# Patient Record
Sex: Female | Born: 1985 | Race: White | Hispanic: No | Marital: Married | Smoking: Never smoker
Health system: Southern US, Community
[De-identification: ages and names within clinical notes are randomized; demographics above are authoritative.]

## PROBLEM LIST (undated history)

## (undated) DIAGNOSIS — K5090 Crohn's disease, unspecified, without complications: Secondary | ICD-10-CM

---

## 2011-08-09 DIAGNOSIS — T8859XA Other complications of anesthesia, initial encounter: Secondary | ICD-10-CM

## 2011-08-09 HISTORY — DX: Other complications of anesthesia, initial encounter: T88.59XA

## 2020-01-26 ENCOUNTER — Emergency Department (HOSPITAL_COMMUNITY): Payer: BC Managed Care – PPO | Admitting: Anesthesiology

## 2020-01-26 ENCOUNTER — Encounter (HOSPITAL_COMMUNITY)
Admission: EM | Disposition: A | Discharge status: Home / Self Care | Payer: Self-pay | Source: Home / Self Care | Attending: Emergency Medicine

## 2020-01-26 ENCOUNTER — Encounter (HOSPITAL_COMMUNITY): Payer: Self-pay | Admitting: Emergency Medicine

## 2020-01-26 ENCOUNTER — Emergency Department (HOSPITAL_COMMUNITY)
Admission: EM | Admit: 2020-01-26 | Discharge: 2020-01-26 | Disposition: A | Discharge status: Home / Self Care | Payer: BC Managed Care – PPO | Attending: Emergency Medicine | Admitting: Emergency Medicine

## 2020-01-26 ENCOUNTER — Other Ambulatory Visit: Payer: Self-pay

## 2020-01-26 ENCOUNTER — Emergency Department (HOSPITAL_COMMUNITY): Payer: BC Managed Care – PPO

## 2020-01-26 DIAGNOSIS — K5090 Crohn's disease, unspecified, without complications: Secondary | ICD-10-CM | POA: Insufficient documentation

## 2020-01-26 DIAGNOSIS — K222 Esophageal obstruction: Secondary | ICD-10-CM

## 2020-01-26 DIAGNOSIS — Z20822 Contact with and (suspected) exposure to covid-19: Secondary | ICD-10-CM | POA: Diagnosis not present

## 2020-01-26 DIAGNOSIS — X58XXXA Exposure to other specified factors, initial encounter: Secondary | ICD-10-CM | POA: Insufficient documentation

## 2020-01-26 DIAGNOSIS — Y929 Unspecified place or not applicable: Secondary | ICD-10-CM | POA: Diagnosis not present

## 2020-01-26 DIAGNOSIS — T18128A Food in esophagus causing other injury, initial encounter: Secondary | ICD-10-CM | POA: Diagnosis not present

## 2020-01-26 DIAGNOSIS — Y9389 Activity, other specified: Secondary | ICD-10-CM | POA: Insufficient documentation

## 2020-01-26 DIAGNOSIS — Y999 Unspecified external cause status: Secondary | ICD-10-CM | POA: Diagnosis not present

## 2020-01-26 DIAGNOSIS — K224 Dyskinesia of esophagus: Secondary | ICD-10-CM | POA: Diagnosis present

## 2020-01-26 HISTORY — PX: FOREIGN BODY REMOVAL: SHX962

## 2020-01-26 HISTORY — PX: BIOPSY: SHX5522

## 2020-01-26 HISTORY — DX: Crohn's disease, unspecified, without complications: K50.90

## 2020-01-26 HISTORY — PX: ESOPHAGOGASTRODUODENOSCOPY (EGD) WITH PROPOFOL: SHX5813

## 2020-01-26 LAB — COMPREHENSIVE METABOLIC PANEL
ALT: 21 U/L (ref 0–44)
AST: 30 U/L (ref 15–41)
Albumin: 4.2 g/dL (ref 3.5–5.0)
Alkaline Phosphatase: 42 U/L (ref 38–126)
Anion gap: 10 (ref 5–15)
BUN: 10 mg/dL (ref 6–20)
CO2: 27 mmol/L (ref 22–32)
Calcium: 9.6 mg/dL (ref 8.9–10.3)
Chloride: 104 mmol/L (ref 98–111)
Creatinine, Ser: 0.95 mg/dL (ref 0.44–1.00)
GFR calc Af Amer: >60 mL/min (ref >60)
GFR calc non Af Amer: >60 mL/min (ref >60)
Glucose, Bld: 87 mg/dL (ref 70–99)
Potassium: 3.4 mmol/L — ABNORMAL LOW (ref 3.5–5.1)
Sodium: 141 mmol/L (ref 135–145)
Total Bilirubin: 0.6 mg/dL (ref 0.3–1.2)
Total Protein: 7.7 g/dL (ref 6.5–8.1)

## 2020-01-26 LAB — CBC WITH DIFFERENTIAL/PLATELET
Abs Immature Granulocytes: 0.02 10*3/uL (ref 0.00–0.07)
Basophils Absolute: 0.1 10*3/uL (ref 0.0–0.1)
Basophils Relative: 1 %
Eosinophils Absolute: 0.4 10*3/uL (ref 0.0–0.5)
Eosinophils Relative: 5 %
HCT: 44.3 % (ref 36.0–46.0)
Hemoglobin: 14.3 g/dL (ref 12.0–15.0)
Immature Granulocytes: 0 %
Lymphocytes Relative: 25 %
Lymphs Abs: 2 10*3/uL (ref 0.7–4.0)
MCH: 30 pg (ref 26.0–34.0)
MCHC: 32.3 g/dL (ref 30.0–36.0)
MCV: 92.9 fL (ref 80.0–100.0)
Monocytes Absolute: 0.5 10*3/uL (ref 0.1–1.0)
Monocytes Relative: 7 %
Neutro Abs: 4.8 10*3/uL (ref 1.7–7.7)
Neutrophils Relative %: 62 %
Platelets: 345 10*3/uL (ref 150–400)
RBC: 4.77 MIL/uL (ref 3.87–5.11)
RDW: 13.7 % (ref 11.5–15.5)
WBC: 7.8 10*3/uL (ref 4.0–10.5)
nRBC: 0 % (ref 0.0–0.2)

## 2020-01-26 LAB — LIPASE, BLOOD: Lipase: 35 U/L (ref 11–51)

## 2020-01-26 LAB — SARS CORONAVIRUS 2 BY RT PCR (HOSPITAL ORDER, PERFORMED IN ~~LOC~~ HOSPITAL LAB): SARS Coronavirus 2: NEGATIVE

## 2020-01-26 SURGERY — ESOPHAGOGASTRODUODENOSCOPY (EGD) WITH PROPOFOL
Anesthesia: General

## 2020-01-26 SURGERY — ESOPHAGOGASTRODUODENOSCOPY (EGD) WITH PROPOFOL
Anesthesia: Monitor Anesthesia Care | Laterality: Left

## 2020-01-26 MED ORDER — KETOROLAC TROMETHAMINE 30 MG/ML IJ SOLN
30.0000 mg | Freq: Once | INTRAMUSCULAR | Status: AC
  Administered 2020-01-26: 30 mg via INTRAVENOUS
  Filled 2020-01-26: qty 1

## 2020-01-26 MED ORDER — ONDANSETRON HCL 4 MG/5ML PO SOLN
4.0000 mg | Freq: Once | ORAL | Status: DC
  Filled 2020-01-26: qty 5

## 2020-01-26 MED ORDER — PHENYLEPHRINE 40 MCG/ML (10ML) SYRINGE FOR IV PUSH (FOR BLOOD PRESSURE SUPPORT)
PREFILLED_SYRINGE | INTRAVENOUS | Status: DC | PRN
  Administered 2020-01-26 (×2): 80 ug via INTRAVENOUS

## 2020-01-26 MED ORDER — ONDANSETRON HCL 4 MG/2ML IJ SOLN: 4.0000 mg | Freq: Once | INTRAMUSCULAR | Status: DC

## 2020-01-26 MED ORDER — SUCCINYLCHOLINE CHLORIDE 200 MG/10ML IV SOSY
PREFILLED_SYRINGE | INTRAVENOUS | Status: DC | PRN
  Administered 2020-01-26: 140 mg via INTRAVENOUS

## 2020-01-26 MED ORDER — SODIUM CHLORIDE 0.9 % IV BOLUS
1000.0000 mL | Freq: Once | INTRAVENOUS | Status: AC
  Administered 2020-01-26: 1000 mL via INTRAVENOUS

## 2020-01-26 MED ORDER — ONDANSETRON HCL 4 MG/2ML IJ SOLN
INTRAMUSCULAR | Status: AC
  Filled 2020-01-26: qty 2

## 2020-01-26 MED ORDER — FENTANYL CITRATE (PF) 100 MCG/2ML IJ SOLN
INTRAMUSCULAR | Status: DC | PRN
  Administered 2020-01-26: 75 ug via INTRAVENOUS
  Administered 2020-01-26: 25 ug via INTRAVENOUS

## 2020-01-26 MED ORDER — PROPOFOL 10 MG/ML IV BOLUS
INTRAVENOUS | Status: DC | PRN
  Administered 2020-01-26: 130 mg via INTRAVENOUS

## 2020-01-26 MED ORDER — PANTOPRAZOLE SODIUM 40 MG IV SOLR
40.0000 mg | Freq: Once | INTRAVENOUS | Status: AC
  Administered 2020-01-26: 40 mg via INTRAVENOUS
  Filled 2020-01-26: qty 40

## 2020-01-26 MED ORDER — PROPOFOL 10 MG/ML IV BOLUS
INTRAVENOUS | Status: AC
  Filled 2020-01-26: qty 20

## 2020-01-26 MED ORDER — MIDAZOLAM HCL 5 MG/5ML IJ SOLN
INTRAMUSCULAR | Status: DC | PRN
  Administered 2020-01-26: 2 mg via INTRAVENOUS

## 2020-01-26 MED ORDER — GLUCAGON HCL RDNA (DIAGNOSTIC) 1 MG IJ SOLR
1.0000 mg | Freq: Once | INTRAMUSCULAR | Status: AC
  Administered 2020-01-26: 1 mg via INTRAVENOUS
  Filled 2020-01-26: qty 1

## 2020-01-26 MED ORDER — SODIUM CHLORIDE 0.9 % IV SOLN: INTRAVENOUS | Status: DC

## 2020-01-26 MED ORDER — FENTANYL CITRATE (PF) 100 MCG/2ML IJ SOLN
INTRAMUSCULAR | Status: AC
  Filled 2020-01-26: qty 2

## 2020-01-26 MED ORDER — MIDAZOLAM HCL 2 MG/2ML IJ SOLN
INTRAMUSCULAR | Status: AC
  Filled 2020-01-26: qty 2

## 2020-01-26 MED ORDER — SODIUM CHLORIDE 0.9 % IV BOLUS: 1000.0000 mL | Freq: Once | INTRAVENOUS | Status: DC

## 2020-01-26 MED ORDER — ONDANSETRON HCL 4 MG/2ML IJ SOLN
4.0000 mg | Freq: Once | INTRAMUSCULAR | Status: DC
  Administered 2020-01-26: 4 mg via INTRAVENOUS
  Filled 2020-01-26: qty 2

## 2020-01-26 MED ORDER — LACTATED RINGERS IV SOLN
INTRAVENOUS | Status: DC
  Administered 2020-01-26: 1000 mL via INTRAVENOUS

## 2020-01-26 SURGICAL SUPPLY — 14 items

## 2020-01-26 NOTE — Op Note (Signed)
[facility] Patient Name: Cynthia Santiago Procedure Date: 01/26/2020 MRN: [id number] Attending MD: Charna Elizabeth , MD Date of Birth: 06/28/1986 CSN: [id number] Age: 34 Admit Type: Outpatient Procedure:                EGD with removal of a food impaction and esophageal                            biopsies. Indications:              Dysphagia, Gastro-esophageal reflux disease. Providers:                Charna Elizabeth, MD, Rogue Jury, RN, Dayton Bailiff, RN, Wanita Chamberlain, Technician, Paris Lore CRNA. Referring MD:             Joselyn Arrow, MD Medicines:                General Anesthesia. Complications:            No immediate complications. Estimated Blood Loss:     Estimated blood loss was minimal. Procedure:                Pre-Anesthesia Assessment: - Prior to the                            procedure, a history and physical was performed,                            and patient medications and allergies were                            reviewed. The patient's tolerance of previous                            anesthesia was also reviewed. The risks and                            benefits of the procedure and the sedation options                            and risks were discussed with the patient. All                            questions were answered, and informed consent was                            obtained. Prior Anticoagulants: The patient has                            taken no previous anticoagulant or antiplatelet  agents. ASA Grade Assessment: II - A patient with                            mild systemic disease. After reviewing the risks                            and benefits, the patient was deemed in                            satisfactory condition to undergo the procedure.                            After obtaining informed consent, the endoscope was                             passed under direct vision. Throughout the                            procedure, the patient's blood pressure, pulse, and                            oxygen saturations were monitored continuously. The                            GIF-H190 ([id number]) Olympus gastroscope was                            introduced through the mouth, and advanced to the                            second part of duodenum. The EGD was extremely                            difficult due to presence of food imaction. The                            patient tolerated the procedure well. Scope In: Scope Out: Findings:      A large meat bolus was found in the middle third of the esophagus; this       was broken up in fragments with a Rat tooth forceps and 13 mm snare and       after some pieces were removed the rest of the bolus was "gently pushed"       into the stomach.      Mucosal changes including ringed esophagus were found in the middle       third of the esophagus and in the lower third of the esophagus;       esophageal findings were graded using the Eosinophilic Esophagitis       Endoscopic Reference Score (EoE-EREFS) as: Edema Grade 0 Normal       (distinct vascular markings), Rings Grade 2 Moderate (distinct rings       that do not occlude passage of diagnostic 8-10 mm endoscope), Exudates       Grade 1 Mild (scattered white lesions involving less than 10 percent of  the esophageal surface area), Furrows Grade 0 None (no vertical lines       seen) and Stricture none (no stricture found)-biopsies were taken with a       cold forceps for histology.      The entire examined stomach was normal.      The cardia and gastric fundus were normal on retroflexion.      The examined duodenum was normal. Impression:               - Large meat bolus in the middle third of the                            esophagus-removal was successful-see description                            above.                           -  Esophageal mucosal changes suggestive of                            Eosinophilic Esophagitis-biopsies done.                           - Normal appearing stomach.                           - Normal examined duodenum. Moderate Sedation:      GA used,. Recommendation:           - Full liquid diet daily.                           - Continue present medications; may need to start                            Flovent inhaler 880 mcg 2 puffs every 12 hours.                           - Await pathology results.                           - Use Protonix (Pantoprazole) 40 mg PO daily.                           - No Ibuprofen, Naproxen, or other non-steroidal                            anti-inflammatory drugs for now.                           - Return to primary GI office in 1-2 weeks. Procedure Code(s):        --- Professional ---                           [phone], 59, Esophagogastroduodenoscopy, flexible,  transoral; with removal of foreign body(s)                           43239, Esophagogastroduodenoscopy, flexible,                            transoral; with biopsy, single or multiple Diagnosis Code(s):        --- Professional ---                           R13.10, Dysphagia, unspecified                           K21.9, Gastro-esophageal reflux disease without                            esophagitis                           T18.128A, Food in esophagus causing other injury,                            initial encounter                           K22.8, Other specified diseases of esophagus CPT copyright 2019 [employer]. All rights reserved. The codes documented in this report are preliminary and upon coder review may  be revised to meet current compliance requirements. Charna Elizabeth, MD Charna Elizabeth, MD 01/26/2020 12:30:18 PM This report has been signed electronically. Number of Addenda: 0

## 2020-01-26 NOTE — ED Provider Notes (Signed)
[facility] DEPT Provider Note   CSN: [id number] Arrival date & time: 01/26/20  9528     History Chief Complaint  Patient presents with  . Gastroesophageal Reflux    Cynthia Santiago is a 34 y.o. female with pertinent past medical history of Crohn's disease that presents the emergency department today for esophageal spasms.  Patient states that she ate a piece of chicken on Friday, it got stuck in her throat and she vomited up.  States that it occurred again last night with chicken, however it took her a while to vomit it up.  She states that she does not think she vomited it entirely.  She states since then she is having esophageal spasms every time she swallows her own spit.  She says that she cannot keep anything down.  States that it feels like a stabbing sensation and something is stuck in her throat.  States that she is normally able to vomit up her food whenever this occurs, this has never happened to this extent before.  She states that her Crohn's is well controlled, she states that she thinks she got an endoscopy in March of this year with no strictures or abnormalities of her esophagus.  States that she cannot swallow her spit currently, however denies any trouble breathing.  Also admits to some regurgitation, states that she started having acid reflux after this happened last night. Denies any chest pain, shortness of breath, back pain, dizziness, headache, vision changes.  No fevers, chills.  Was in normal health before this.  HPI     Past Medical History:  Diagnosis Date  . Complication of anesthesia 2013   awake during anesthesia  . Crohn's disease ([place])     There are no problems to display for this patient.   History reviewed. No pertinent surgical history.   OB History   No obstetric history on file.     History reviewed. No pertinent family history.  Social History   Tobacco Use  . Smoking status: Never Smoker  . Smokeless  tobacco: Never Used  Substance Use Topics  . Alcohol use: Not on file  . Drug use: Not on file    Home Medications Prior to Admission medications   Medication Sig Start Date End Date Taking? Authorizing Provider  albuterol (VENTOLIN HFA) 108 (90 Base) MCG/ACT inhaler Inhale 1-2 puffs into the lungs every 6 (six) hours as needed for wheezing or shortness of breath. 01/18/20  Yes [provider]  balsalazide (COLAZAL) 750 MG capsule Take 3,750 mg by mouth daily. 09/24/19  Yes [provider]  cetirizine (ZYRTEC) 10 MG tablet Take 10 mg by mouth daily.   Yes [provider]  etonogestrel-ethinyl estradiol (NUVARING) 0.12-0.015 MG/24HR vaginal ring Place 1 each vaginally every 21 ( twenty-one) days. 01/16/20  Yes [provider]  STELARA 90 MG/ML SOSY injection Inject 1 mL into the skin every 8 (eight) weeks. 01/16/20  Yes [provider]    Allergies    Shellfish allergy, Ginger, and Morphine  Review of Systems   Review of Systems  Constitutional: Negative for chills, diaphoresis, fatigue and fever.  HENT: Positive for trouble swallowing. Negative for congestion, ear discharge, ear pain, facial swelling, hearing loss, mouth sores, sinus pressure and sore throat.   Eyes: Negative for pain and visual disturbance.  Respiratory: Negative for cough, shortness of breath and wheezing.   Cardiovascular: Negative for chest pain, palpitations and leg swelling.  Gastrointestinal: Positive for nausea. Negative for abdominal  distention, abdominal pain, diarrhea and vomiting.  Genitourinary: Negative for difficulty urinating.  Musculoskeletal: Negative for back pain, neck pain and neck stiffness.  Skin: Negative for pallor.  Neurological: Negative for dizziness, speech difficulty, weakness and headaches.  Psychiatric/Behavioral: Negative for confusion.    Physical Exam Updated Vital Signs BP 113/75   Pulse 71   Temp (!) 97 F (36.1 C) (Oral)   Resp 16    Ht 5\' 3"  (1.6 m)   Wt 47.6 kg   LMP 01/19/2020   SpO2 100%   BMI 18.60 kg/m   Physical Exam Constitutional:      General: She is not in acute distress.    Appearance: Normal appearance. She is not ill-appearing, toxic-appearing or diaphoretic.  HENT:     Head:     Jaw: There is normal jaw occlusion. No trismus, tenderness, swelling or malocclusion.     Comments: Patient without drooling, no trismus.  Patient is able to speak to me in full sentences without any respiratory compromise.  Patient states that she is unable to handle her secretions at this time.    Mouth/Throat:     Mouth: Mucous membranes are moist. No angioedema.     Pharynx: Oropharynx is clear. Uvula midline. No pharyngeal swelling, oropharyngeal exudate, posterior oropharyngeal erythema or uvula swelling.     Tonsils: No tonsillar exudate. 1+ on the right. 1+ on the left.     Comments: Nothing lodged in posterior pharynx, and unable to see further than that. Eyes:     General: No scleral icterus.    Extraocular Movements: Extraocular movements intact.     Pupils: Pupils are equal, round, and reactive to light.  Cardiovascular:     Rate and Rhythm: Normal rate and regular rhythm.     Pulses: Normal pulses.     Heart sounds: Normal heart sounds.  Pulmonary:     Effort: Pulmonary effort is normal. No respiratory distress.     Breath sounds: Normal breath sounds. No stridor. No wheezing, rhonchi or rales.  Chest:     Chest wall: No tenderness.  Abdominal:     General: Abdomen is flat. There is no distension.     Palpations: Abdomen is soft.     Tenderness: There is no abdominal tenderness. There is no guarding or rebound.  Musculoskeletal:        General: No swelling or tenderness. Normal range of motion.     Cervical back: Normal range of motion and neck supple. No rigidity.     Right lower leg: No edema.     Left lower leg: No edema.  Skin:    General: Skin is warm and dry.     Capillary Refill: Capillary  refill takes less than 2 seconds.     Coloration: Skin is not pale.  Neurological:     General: No focal deficit present.     Mental Status: She is alert and oriented to person, place, and time.  Psychiatric:        Mood and Affect: Mood normal.        Behavior: Behavior normal.     ED Results / Procedures / Treatments   Labs (all labs ordered are listed, but only abnormal results are displayed) Labs Reviewed  COMPREHENSIVE METABOLIC PANEL - Abnormal; Notable for the following components:      Result Value   Potassium 3.4 (*)    All other components within normal limits  SARS CORONAVIRUS 2 BY RT PCR (HOSPITAL ORDER, PERFORMED IN  [facility])  SARS CORONAVIRUS 2 (TAT 6-24 HRS)  CBC WITH DIFFERENTIAL/PLATELET  LIPASE, BLOOD  SURGICAL PATHOLOGY    EKG None  Radiology DG Chest 2 View  Result Date: 01/26/2020 CLINICAL DATA:  Esophageal spasm. EXAM: CHEST - 2 VIEW COMPARISON:  None. FINDINGS: The heart, hila, and mediastinum are normal. No nodules or masses. No focal infiltrates. No other abnormalities. IMPRESSION: No active cardiopulmonary disease. Electronically Signed   By: Gerome Sam III M.D   On: 01/26/2020 09:31    Procedures Procedures (including critical care time)  Medications Ordered in ED Medications  0.9 %  sodium chloride infusion (has no administration in time range)  sodium chloride 0.9 % bolus 1,000 mL (1,000 mLs Intravenous Refused 01/26/20 1422)  glucagon (human recombinant) (GLUCAGEN) injection 1 mg (1 mg Intravenous Given 01/26/20 0846)  pantoprazole (PROTONIX) injection 40 mg (40 mg Intravenous Given 01/26/20 0846)  sodium chloride 0.9 % bolus 1,000 mL (0 mLs Intravenous Stopped 01/26/20 1041)  ketorolac (TORADOL) 30 MG/ML injection 30 mg (30 mg Intravenous Given 01/26/20 1422)    ED Course  I have reviewed the triage vital signs and the nursing notes.  Pertinent labs & imaging results that were available during my care of the patient  were reviewed by me and considered in my medical decision making (see chart for details).    MDM Rules/Calculators/A&P                         Cynthia Santiago is a 34 y.o. female with pertinent past medical history of Crohn's disease that presents the emergency department today for esophageal spasms.patient is stable without respiratory compromise, states that she is unable to handle her secretions.  When patient swallows her own spit, she starts to have esophageal spasms.  No drooling, trismus, muffled voice. Concerns for food bolus impaction, will consult GI at this time.  We will also give glucagon at this time.  CBC, CMP, lipase stable.   Reassessed patient after glucagon, patient is still unable to pass p.o. challenge with water.  Continues to have esophageal spasms. CXR negative.  945 spoke to Dr. Loreta Ave, GI who states that she will try and get her scheduled in today, states that she will call me back with the plan.  Patient to be taken to the endoscopy suite in a couple hours.  Patient has returned from endoscopy suite, took them 30 minutes to retrieve food bolus.  Patient states that  she was able to feel relief, has been able to drink Sprite without any regurgitation.  Is able to handle secretions now.  Still complaining of pain and feeling dehydrated, will give IV fluids and pain medication at this time and reassess.  CMP stable, potassium at 3.4.  Shared decision making, patient states that she will eats bananas at home. After reassessment an hour later, patient states that she feels better, pain has been controlled.  States that she wants to go home.  I was able to watch patient drink a full cup of Sprite without any esophageal spasms or vomiting.  Patient states that she is able to handle her secretions now.  Patient ready for discharge.  Doubt need for further emergent work up at this time. I explained the diagnosis and have given explicit precautions to return to the ER including for any  other new or worsening symptoms. The patient understands and accepts the medical plan as it's been dictated and I have answered their questions.  Discharge instructions concerning home care and prescriptions have been given. The patient is STABLE and is discharged to home in good condition.     Final Clinical Impression(s) / ED Diagnoses Final diagnoses:  Esophageal obstruction due to food impaction    Rx / DC Orders ED Discharge Orders    None       Farrel Gordon, PA-C 01/26/20 1525    Rolan Bucco, MD 01/29/20 [phone]

## 2020-01-26 NOTE — Anesthesia Procedure Notes (Addendum)
Procedure Name: Intubation Date/Time: 01/26/2020 11:39 AM Performed by: Anne Fu, CRNA Pre-anesthesia Checklist: Patient identified, Emergency Drugs available, Suction available, Patient being monitored and Timeout performed Patient Re-evaluated:Patient Re-evaluated prior to induction Oxygen Delivery Method: Circle system utilized Preoxygenation: Pre-oxygenation with 100% oxygen Induction Type: IV induction, Rapid sequence and Cricoid Pressure applied Laryngoscope Size: Mac and 4 Grade View: Grade I Tube type: Oral Tube size: 7.5 mm Number of attempts: 1 Airway Equipment and Method: Stylet Placement Confirmation: ETT inserted through vocal cords under direct vision,  positive ETCO2 and breath sounds checked- equal and bilateral Secured at: 19 cm Tube secured with: Tape Dental Injury: Teeth and Oropharynx as per pre-operative assessment

## 2020-01-26 NOTE — Anesthesia Preprocedure Evaluation (Signed)
Anesthesia Evaluation  Patient identified by MRN, date of birth, ID band Patient awake    Reviewed: Allergy & Precautions, NPO status , Patient's Chart, lab work & pertinent test results  Airway Mallampati: I  TM Distance: >3 FB Neck ROM: Full    Dental   Pulmonary    Pulmonary exam normal        Cardiovascular Normal cardiovascular exam     Neuro/Psych    GI/Hepatic   Endo/Other    Renal/GU      Musculoskeletal   Abdominal   Peds  Hematology   Anesthesia Other Findings   Reproductive/Obstetrics                             Anesthesia Physical Anesthesia Plan  ASA: II  Anesthesia Plan: General   Post-op Pain Management:    Induction: Intravenous, Rapid sequence and Cricoid pressure planned  PONV Risk Score and Plan: Ondansetron  Airway Management Planned: Oral ETT  Additional Equipment:   Intra-op Plan:   Post-operative Plan: Extubation in OR  Informed Consent: I have reviewed the patients History and Physical, chart, labs and discussed the procedure including the risks, benefits and alternatives for the proposed anesthesia with the patient or authorized representative who has indicated his/her understanding and acceptance.       Plan Discussed with: CRNA and Surgeon  Anesthesia Plan Comments:         Anesthesia Quick Evaluation

## 2020-01-26 NOTE — Discharge Instructions (Addendum)
You are seen today for food bolus impaction, follow the instructions that I gave you at the endoscopy suite.  If you have any worsening or new concerning symptoms please come back to the emergency department.  As you spoke about your potassium was 3.4, I think this will come back to normal once you start eating.  If this continues to happen I think you should follow-up with your gastroenterologist as we discussed.  I hope you feel better!

## 2020-01-26 NOTE — Progress Notes (Signed)
Post procedure: patient stated pain and tightness in right upper chest. Anesthesia MD and procedure MD assessed patient. No new orders.Report given to ED RN. RN verbalized understanding.

## 2020-01-26 NOTE — ED Notes (Signed)
Pt ambulated to Xray 

## 2020-01-26 NOTE — Anesthesia Postprocedure Evaluation (Signed)
Anesthesia Post Note  Patient: Cynthia Santiago  Procedure(s) Performed: ESOPHAGOGASTRODUODENOSCOPY (EGD) WITH PROPOFOL (N/A ) BIOPSY FOREIGN BODY REMOVAL     Patient location during evaluation: PACU Anesthesia Type: General Level of consciousness: awake and alert Pain management: pain level controlled Vital Signs Assessment: post-procedure vital signs reviewed and stable Respiratory status: spontaneous breathing, nonlabored ventilation, respiratory function stable and patient connected to nasal cannula oxygen Cardiovascular status: blood pressure returned to baseline and stable Postop Assessment: no apparent nausea or vomiting Anesthetic complications: no   No complications documented.  Last Vitals:  Vitals:   01/26/20 1250 01/26/20 1324  BP: 114/78 118/88  Pulse: 70 73  Resp: 12 16  Temp:    SpO2: 100% 97%    Last Pain:  Vitals:   01/26/20 1324  TempSrc:   PainSc: 7                  Stina Gane DAVID

## 2020-01-26 NOTE — ED Triage Notes (Signed)
Per pt, states she got a piece of chicken stuck in throat-states she threw up chicken but still having esophogeal spasms-cant keep anything down

## 2020-01-26 NOTE — Transfer of Care (Signed)
Immediate Anesthesia Transfer of Care Note  Patient: Cynthia Santiago  Procedure(s) Performed: Procedure(s): ESOPHAGOGASTRODUODENOSCOPY (EGD) WITH PROPOFOL (N/A)  Patient Location: PACU  Anesthesia Type:General  Level of Consciousness:  sedated, patient cooperative and responds to stimulation  Airway & Oxygen Therapy:Patient Spontanous Breathing and Patient connected to face mask oxgen  Post-op Assessment:  Report given to PACU RN and Post -op Vital signs reviewed and stable  Post vital signs:  Reviewed and stable  Last Vitals:  Vitals:   01/26/20 1000 01/26/20 1052  BP: 110/76 119/75  Pulse: 61 77  Resp: 16 11  Temp:  36.7 C  SpO2: 100% 100%    Complications: No apparent anesthesia complications

## 2020-01-26 NOTE — Consult Note (Signed)
UNASSIGNED PATIENT/CROSS COVER [facility] Reason for Consult: Food impaction since last night. Referring Physician: Alvie Heidelberg, MD Cynthia Santiago is an 34 y.o. female.  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  She was visiting a friend in [place] and therefore decided to come to the ER here instead of going to [facility] where she works. Apparently she had some chicken to eat and was not able to regurgitate it back up as she usually does when these episodes occur. She claims this has been a problem for the last couple of years but most the time she is able to vomit her food bolus back up. She has started having severe acid reflux and took 4 Tums to help her with her symptoms after " the chicken got stuck in her esophagus". She had a history of acid reflux for which she takes Tums periodically off and on. She is hesitant to use PPIs as she has a history of osteopenia.  She was diagnosed with Crohn's disease at the age of 43 and was on Humira for several years till she had a flare in September of last year and again in March of this year for which she was switched to [facility] and for the last couple of months has been doing well on the [place].  She denies having any aphthous ulcers, skin lesions, joint pains or ocular symptoms from her Crohn's disease.  She reportedly has both small and large bowel Crohn's. For several years she was treated as ulcerative colitis but when she developed small bowel disease her diagnosis was switched to Crohn's. She is apparently had spontaneous pneumothoraces in the past.  She is never had surgery for her Crohn's disease.  She is followed by Dr. Marta Antu at [facility] for her Crohn's disease.  She has a history of asthma and some seasonal allergies but has never been diagnosed with eosinophilic esophagitis in the past.  She claims she had an  EGD a couple of years ago that was essentially unrevealing But I do not have a copy procedure report available to me at the present time.  Past Medical History:  Diagnosis Date  . Crohn's disease both small bowel and colonic Crohn's (HCC)/asthma/seasonal allergies/history of spontaneous pneumothoraces/acid reflux.    History reviewed. No pertinent surgical history.  No family history on file.  Social History:  has no history on file for tobacco use, alcohol use, and drug use.  She is married and she lives in [place]. She works as a Therapist, occupational at [employer].   Allergies:  Allergies  Allergen Reactions  . Shellfish Allergy Rash and Shortness Of Breath    Wheezing   . Morphine Nausea And Vomiting   Medications: I have reviewed the patient's current medications.  Results for orders placed or performed during the hospital encounter of 01/26/20 (from the past 48 hour(s))  CBC with Differential     Status: None   Collection Time: 01/26/20  8:20 AM  Result Value Ref Range   WBC 7.8 4.0 - 10.5 K/uL   RBC 4.77 3.87 - 5.11 MIL/uL   Hemoglobin 14.3 12.0 - 15.0 g/dL   HCT 40.3 36 - 46 %   MCV 92.9 80.0 - 100.0 fL   MCH 30.0 26.0 - 34.0 pg   MCHC 32.3 30.0 - 36.0 g/dL   RDW 13.7  11.5 - 15.5 %   Platelets 345 150 - 400 K/uL   nRBC 0.0 0.0 - 0.2 %   Neutrophils Relative % 62 %   Neutro Abs 4.8 1.7 - 7.7 K/uL   Lymphocytes Relative 25 %   Lymphs Abs 2.0 0.7 - 4.0 K/uL   Monocytes Relative 7 %   Monocytes Absolute 0.5 0 - 1 K/uL   Eosinophils Relative 5 %   Eosinophils Absolute 0.4 0 - 0 K/uL   Basophils Relative 1 %   Basophils Absolute 0.1 0 - 0 K/uL   Immature Granulocytes 0 %   Abs Immature Granulocytes 0.02 0.00 - 0.07 K/uL    Comment: Performed at [facility], [place] [address] [place], [place] [address]  Comprehensive metabolic panel     Status: Abnormal   Collection Time: 01/26/20  8:20 AM  Result Value Ref Range   Sodium  141 135 - 145 mmol/L   Potassium 3.4 (L) 3.5 - 5.1 mmol/L   Chloride 104 98 - 111 mmol/L   CO2 27 22 - 32 mmol/L   Glucose, Bld 87 70 - 99 mg/dL    Comment: Glucose reference range applies only to samples taken after fasting for at least 8 hours.   BUN 10 6 - 20 mg/dL   Creatinine, Ser 0.95 0.44 - 1.00 mg/dL   Calcium 9.6 8.9 - 10.3 mg/dL   Total Protein 7.7 6.5 - 8.1 g/dL   Albumin 4.2 3.5 - 5.0 g/dL   AST 30 15 - 41 U/L   ALT 21 0 - 44 U/L   Alkaline Phosphatase 42 38 - 126 U/L   Total Bilirubin 0.6 0.3 - 1.2 mg/dL   GFR calc non Af Amer >60 >60 mL/min   GFR calc Af Amer >60 >60 mL/min   Anion gap 10 5 - 15    Comment: Performed at [facility], [place] [address], [place], [place] [address]  Lipase, blood     Status: None   Collection Time: 01/26/20  8:20 AM  Result Value Ref Range   Lipase 35 11 - 51 U/L    Comment: Performed at [facility], [place] [address], [place], [place] [address]   DG Chest 2 View  Result Date: 01/26/2020 CLINICAL DATA:  Esophageal spasm. EXAM: CHEST - 2 VIEW COMPARISON:  None. FINDINGS: The heart, hila, and mediastinum are normal. No nodules or masses. No focal infiltrates. No other abnormalities. IMPRESSION: No active cardiopulmonary disease. Electronically Signed   By: Dorise Bullion III M.D   On: 01/26/2020 09:31   ROS:  As stated above in the HPI otherwise negative.  Blood pressure 110/76, pulse 61, temperature 98.6 F (37 C), temperature source Oral, resp. rate 16, height 5\' 3"  (1.6 m), weight 47.6 kg, last menstrual period 01/19/2020, SpO2 100 %.  PE: Gen: NAD, Alert and Oriented, patient is unable to swallow own secretions and is spitting into a bag HEENT:  /AT, EOMI Neck: Supple, no LAD Lungs: CTA Bilaterally CV: RRR without M/G/R ABM: Soft, NTND, +BS Ext: No C/C/E  Assessment/Plan: 1) Food impaction/acid reflux/asthma with seasonal allergies-I suspect she has Eosinophilic Esophagitis. An EGD with  removal of the food bolus is planned for the recommendation made in follow-up.  Patient has been advised to follow-up with her primary gastroenterologist after discharge from the ER here today 2) History of small bowel and colonic Crohn's on [place].  Cynthia Santiago 01/26/2020, 10:14 AM

## 2020-01-27 ENCOUNTER — Encounter (HOSPITAL_COMMUNITY): Payer: Self-pay | Admitting: Gastroenterology

## 2020-01-28 LAB — SURGICAL PATHOLOGY

## 2021-08-05 IMAGING — CR DG CHEST 2V
2 series · 2 of 2 positions shown · non-contrast
Comparison: None.

CLINICAL DATA: Esophageal spasm.

EXAM:
CHEST - 2 VIEW

[w chest pa]
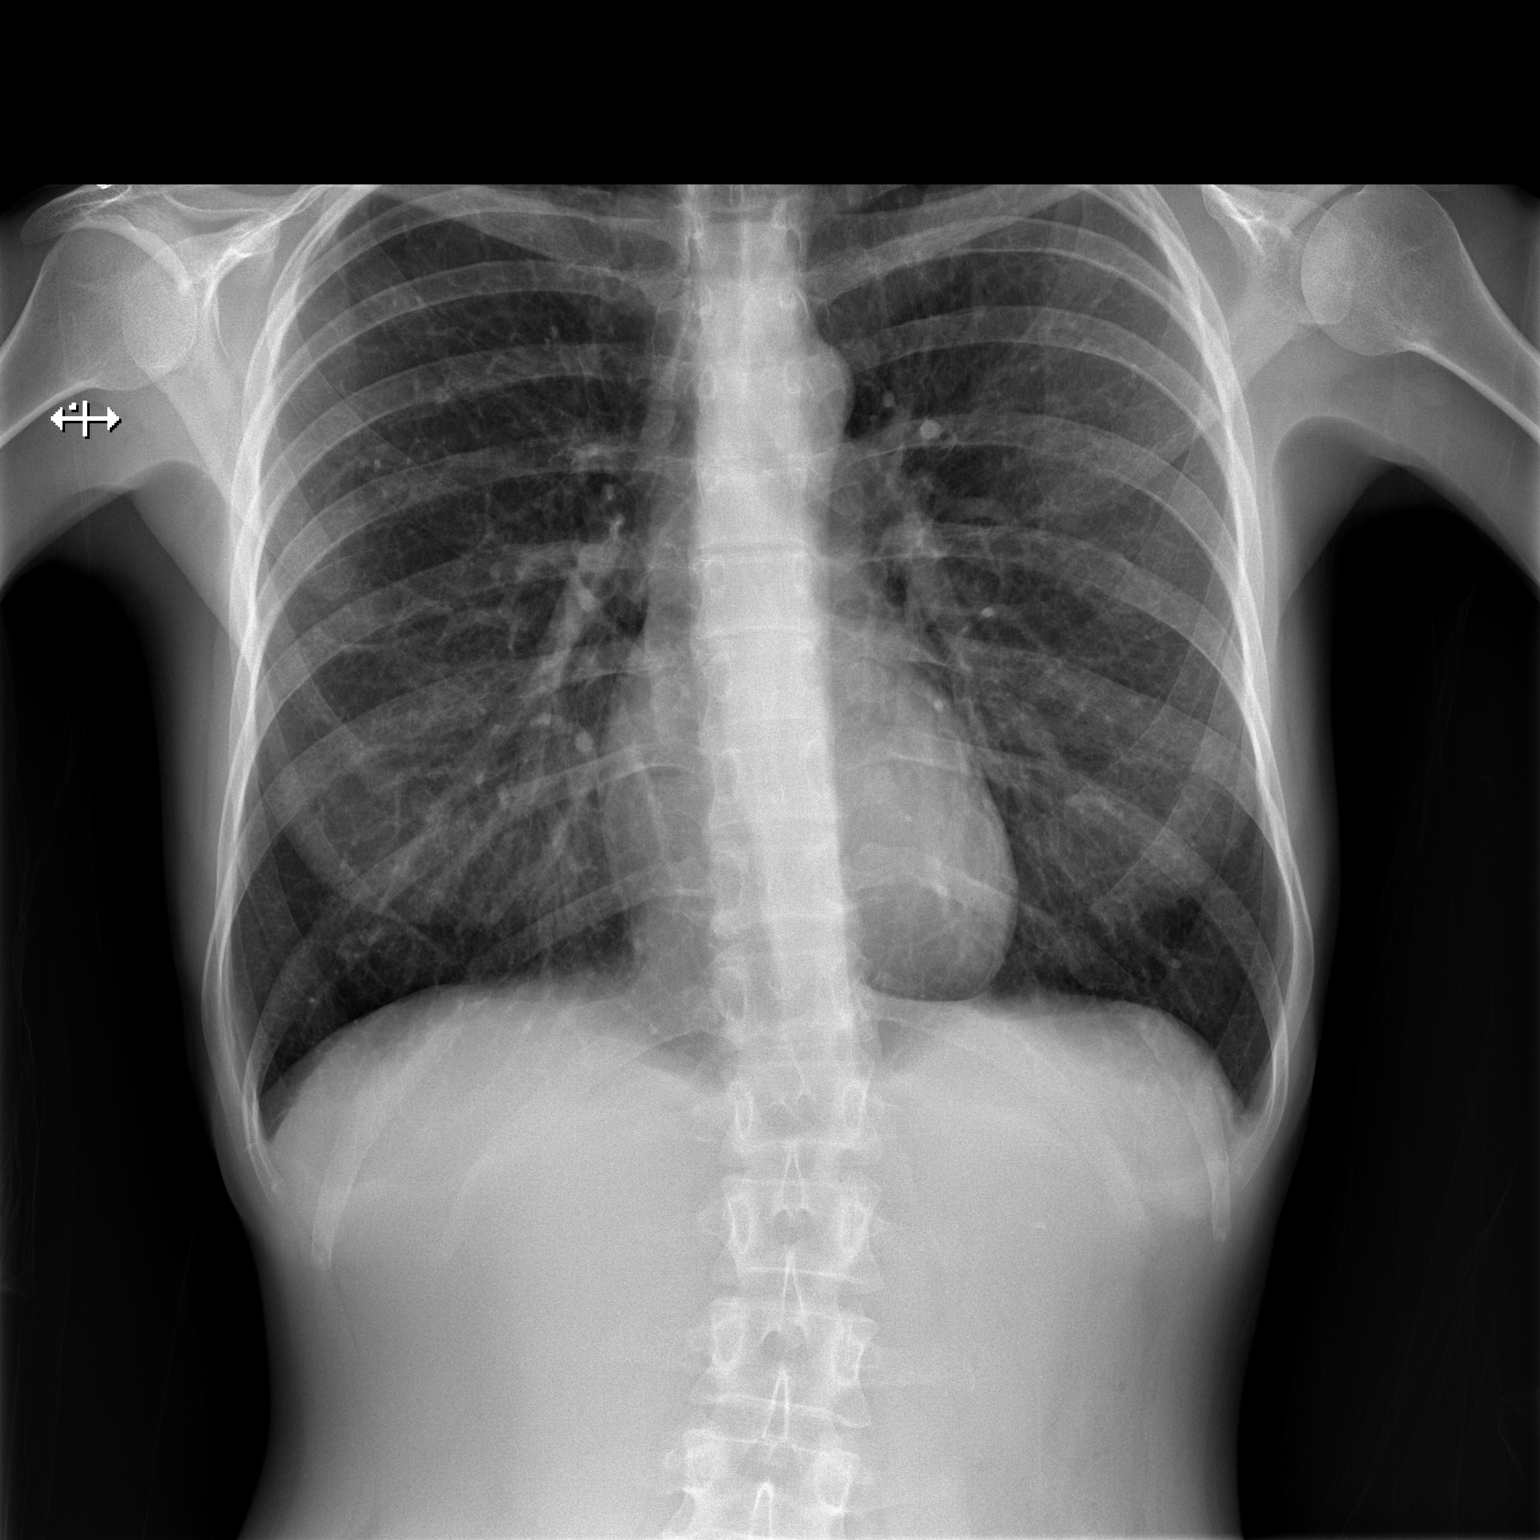

[w chest lat]
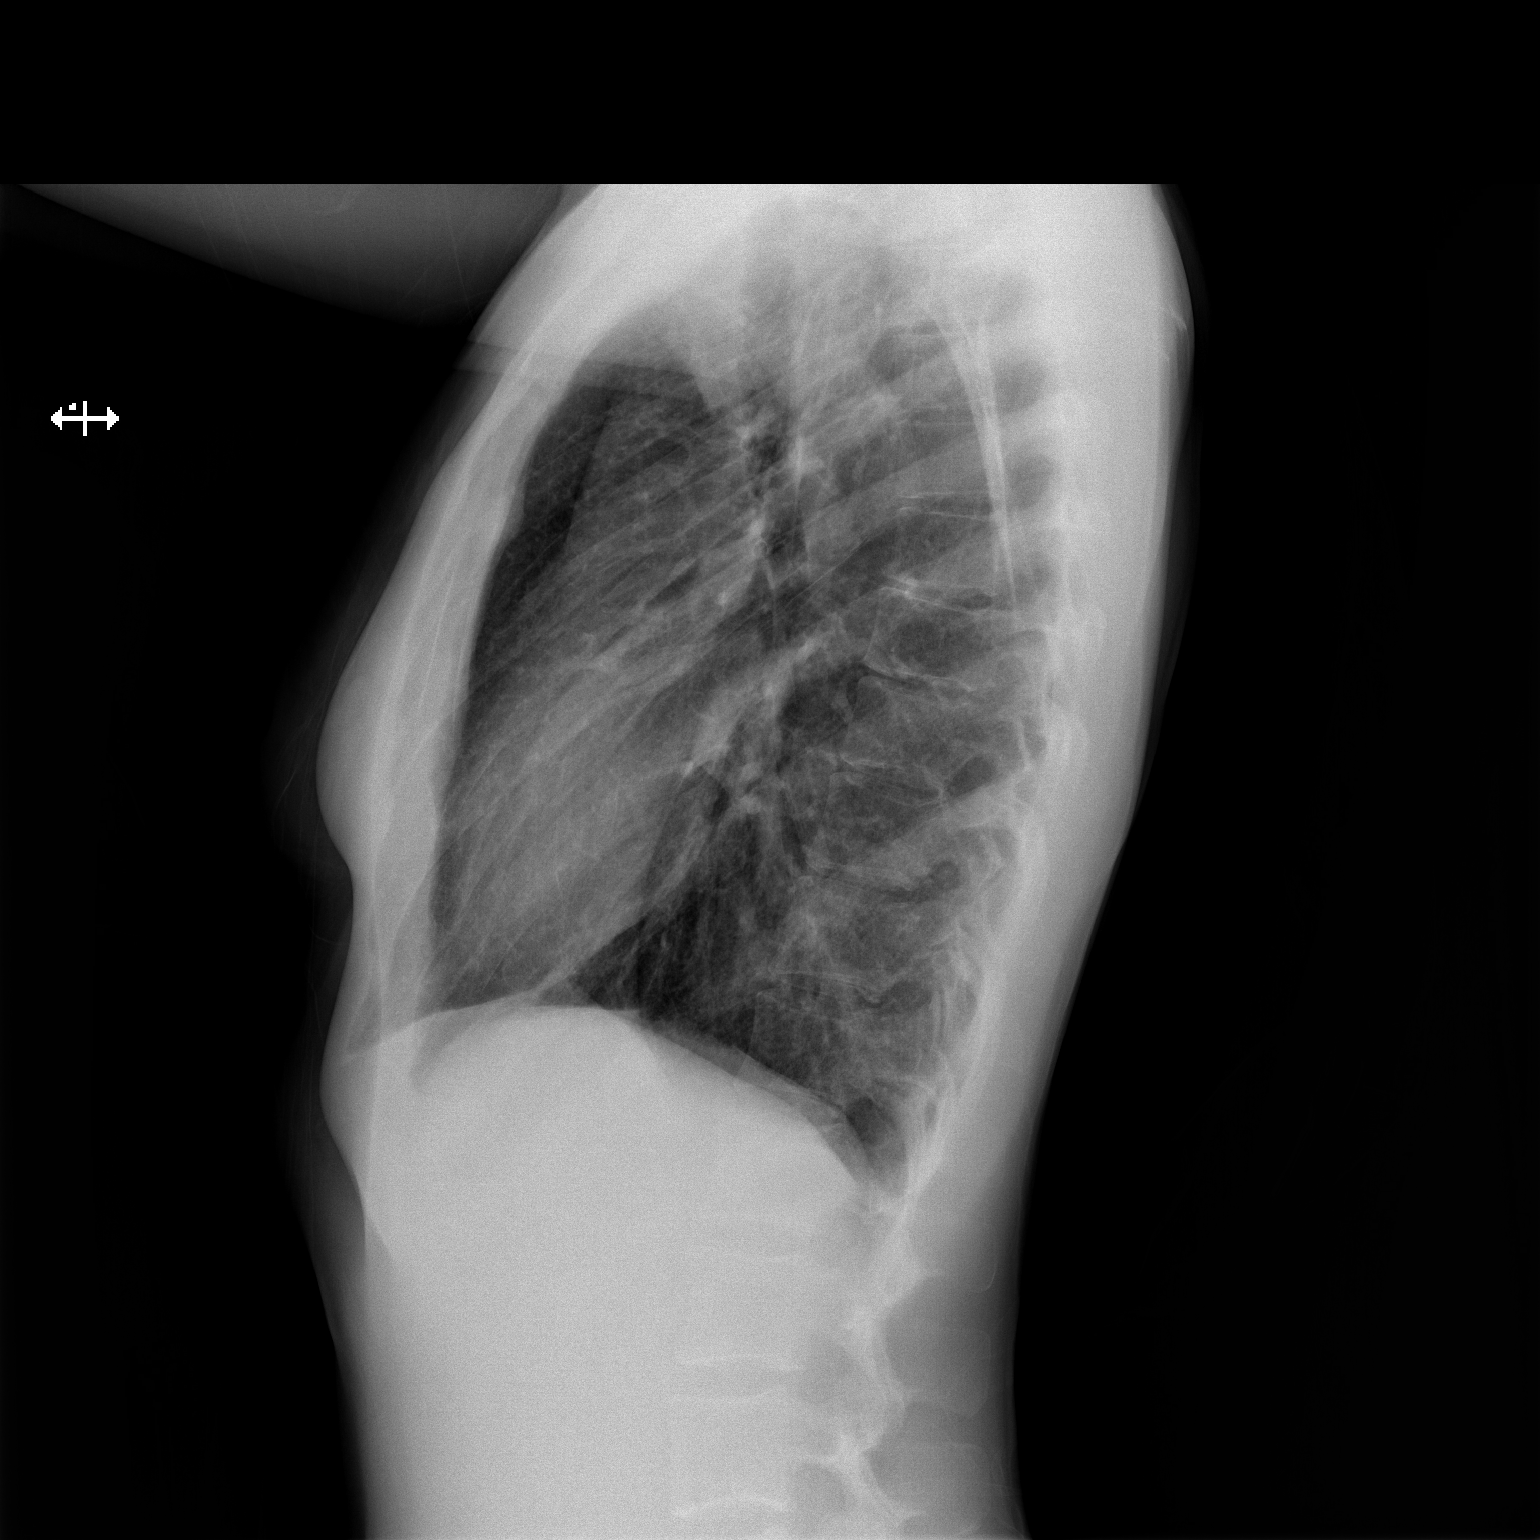

[2 of 2 positions shown; findings below may reference images not displayed]

FINDINGS: The heart, hila, and mediastinum are normal. No nodules or masses.
No focal infiltrates. No other abnormalities.
IMPRESSION: No active cardiopulmonary disease.
